# Patient Record
Sex: Male | Born: 1990 | Race: White | Hispanic: No | Marital: Single | Smoking: Never smoker
Health system: Southern US, Community
[De-identification: ages and names within clinical notes are randomized; demographics above are authoritative.]

---

## 2014-05-29 ENCOUNTER — Emergency Department (HOSPITAL_COMMUNITY)
Admission: EM | Admit: 2014-05-29 | Discharge: 2014-05-29 | Disposition: A | Discharge status: Home / Self Care | Attending: Emergency Medicine | Admitting: Emergency Medicine

## 2014-05-29 ENCOUNTER — Encounter (HOSPITAL_COMMUNITY): Payer: Self-pay | Admitting: Emergency Medicine

## 2014-05-29 DIAGNOSIS — R21 Rash and other nonspecific skin eruption: Secondary | ICD-10-CM | POA: Diagnosis present

## 2014-05-29 DIAGNOSIS — B86 Scabies: Secondary | ICD-10-CM | POA: Diagnosis not present

## 2014-05-29 MED ORDER — PERMETHRIN 5 % EX CREA: TOPICAL_CREAM | CUTANEOUS | Status: AC

## 2014-05-29 NOTE — ED Notes (Signed)
Pt denies having any animals in house.  St's noticed bites today after taking a nap.  C/o itching

## 2014-05-29 NOTE — ED Provider Notes (Signed)
CSN: [id number]     Arrival date & time 05/29/14  2126 History  This chart was scribed for Celene Skeen, PA-C working with Linwood Dibbles, MD by Elveria Rising, ED Scribe. This patient was seen in room TR08C/TR08C and the patient's care was started at 9:46 PM.   Chief Complaint  Patient presents with  . Insect Bite   The history is provided by the patient. No language interpreter was used.   HPI Comments: Sean Suarez is a 24 y.o. male who presents to the Emergency Department complaining of suspected insect bites after a nap at home today. Patient reports pruritic rash primarily to his extremities and back. Patient reports treatment with OTC anti itch cream, without reliefPatient evaluated for the bites and treated with Clindamycin for cellulitis of his right foot extending to his calf. Patient still taking the antibiotics. Patient reports improvement of the infection.  Patient lives alone. Patient reports moving into his new place one month ago. Patient denies recent contacts with similar rash.  Patient denies introduction of new dermatological products or exposure to animals.   History reviewed. No pertinent past medical history. History reviewed. No pertinent past surgical history. History reviewed. No pertinent family history. History  Substance Use Topics  . Smoking status: Never Smoker   . Smokeless tobacco: Not on file  . Alcohol Use: Yes    Review of Systems  Constitutional: Negative for fever and chills.  HENT: Negative for trouble swallowing.   Respiratory: Negative for shortness of breath.   Genitourinary: Negative.   Skin: Positive for color change.       Insect bites  All other systems reviewed and are negative.     Allergies  Review of patient's allergies indicates no known allergies.  Home Medications   Prior to Admission medications   Medication Sig Start Date End Date Taking? Authorizing Provider  permethrin (ELIMITE) 5 % cream Apply to affected area once, leave on  for 8-12 hours and rinse off. 05/29/14   Kathrynn Speed, PA-C   Triage Vitals: BP 117/74 mmHg  Pulse 53  Temp(Src) 98.3 F (36.8 C)  Resp 18  SpO2 99% Physical Exam  Constitutional: He is oriented to person, place, and time. He appears well-developed and well-nourished. No distress.  Unkempt.  HENT:  Head: Normocephalic and atraumatic.  Eyes: Conjunctivae and EOM are normal.  Neck: Normal range of motion. Neck supple.  Cardiovascular: Normal rate, regular rhythm and normal heart sounds.   Pulmonary/Chest: Effort normal and breath sounds normal.  Musculoskeletal: Normal range of motion. He exhibits no edema.  Neurological: He is alert and oriented to person, place, and time.  Skin: Skin is warm and dry.  Scattered erythematous maculopapular lesions on right ankle, behind left leg, right arm, bilateral forearms, in web spaces of right hand, and on back with small burrowing appearance of scabies. No secondary infection.  Psychiatric: He has a normal mood and affect. His behavior is normal.  Nursing note and vitals reviewed.   ED Course  Procedures (including critical care time)  COORDINATION OF CARE: 9:55 PM- Discussed treatment plan with patient at bedside and patient agreed to plan.   Labs Review Labs Reviewed - No data to display  Imaging Review No results found.   EKG Interpretation None      MDM   Final diagnoses:  Scabies   NAD. No secondary infection. Treat with permethrin. Infection care/precautions discussed. Stable for discharge. Return precautions given. Patient states understanding of treatment care plan and is  agreeable.  I personally performed the services described in this documentation, which was scribed in my presence. The recorded information has been reviewed and is accurate.  Kathrynn SpeedRobyn M Tomasita Beevers, PA-C 05/29/14 2205  Linwood DibblesJon Knapp, MD 05/30/14 [phone]

## 2014-05-29 NOTE — Discharge Instructions (Signed)
Apply permethrin cream as directed, leave on for 8-12 hours and rinse off.  Scabies Scabies are small bugs (mites) that burrow under the skin and cause red bumps and severe itching. These bugs can only be seen with a microscope. Scabies are highly contagious. They can spread easily from person to person by direct contact. They are also spread through sharing clothing or linens that have the scabies mites living in them. It is not unusual for an entire family to become infected through shared towels, clothing, or bedding.  HOME CARE INSTRUCTIONS   Your caregiver may prescribe a cream or lotion to kill the mites. If cream is prescribed, massage the cream into the entire body from the neck to the bottom of both feet. Also massage the cream into the scalp and face if your child is less than 22 year old. Avoid the eyes and mouth. Do not wash your hands after application.  Leave the cream on for 8 to 12 hours. Your child should bathe or shower after the 8 to 12 hour application period. Sometimes it is helpful to apply the cream to your child right before bedtime.  One treatment is usually effective and will eliminate approximately 95% of infestations. For severe cases, your caregiver may decide to repeat the treatment in 1 week. Everyone in your household should be treated with one application of the cream.  New rashes or burrows should not appear within 24 to 48 hours after successful treatment. However, the itching and rash may last for 2 to 4 weeks after successful treatment. Your caregiver may prescribe a medicine to help with the itching or to help the rash go away more quickly.  Scabies can live on clothing or linens for up to 3 days. All of your child's recently used clothing, towels, stuffed toys, and bed linens should be washed in hot water and then dried in a dryer for at least 20 minutes on high heat. Items that cannot be washed should be enclosed in a plastic bag for at least 3 days.  To help  relieve itching, bathe your child in a cool bath or apply cool washcloths to the affected areas.  Your child may return to school after treatment with the prescribed cream. SEEK MEDICAL CARE IF:   The itching persists longer than 4 weeks after treatment.  The rash spreads or becomes infected. Signs of infection include red blisters or yellow-tan crust. Document Released: 02/26/2005 Document Revised: 05/21/2011 Document Reviewed: 07/07/2008 [facility]Exit[facility] Patient Information 2015 [place] VillageExitCare, [place]C. This information is not intended to replace advice given to you by your health care provider. Make sure you discuss any questions you have with your health care provider.

## 2014-05-29 NOTE — ED Notes (Signed)
Patient here with numerous insect bites all over his body. States ongoing for 1.5 weeks. Was seen and started on Abx for possible cellulitis of right foot. Currently taking clindamycin. Denies fever. Presents tonight for large number of bites all over body. States they appeared today after nap. Right foot appears better to patient, mild redness noted, but swelling not apparent.

## 2014-06-13 ENCOUNTER — Emergency Department (HOSPITAL_COMMUNITY)

## 2014-06-13 ENCOUNTER — Emergency Department (HOSPITAL_COMMUNITY)
Admission: EM | Admit: 2014-06-13 | Discharge: 2014-06-13 | Disposition: A | Discharge status: Home / Self Care | Attending: Emergency Medicine | Admitting: Emergency Medicine

## 2014-06-13 ENCOUNTER — Encounter (HOSPITAL_COMMUNITY): Payer: Self-pay | Admitting: Emergency Medicine

## 2014-06-13 DIAGNOSIS — M545 Low back pain: Secondary | ICD-10-CM | POA: Diagnosis not present

## 2014-06-13 DIAGNOSIS — D72829 Elevated white blood cell count, unspecified: Secondary | ICD-10-CM

## 2014-06-13 DIAGNOSIS — R109 Unspecified abdominal pain: Secondary | ICD-10-CM | POA: Diagnosis present

## 2014-06-13 DIAGNOSIS — K5289 Other specified noninfective gastroenteritis and colitis: Secondary | ICD-10-CM | POA: Diagnosis not present

## 2014-06-13 DIAGNOSIS — K5900 Constipation, unspecified: Secondary | ICD-10-CM | POA: Insufficient documentation

## 2014-06-13 DIAGNOSIS — R1110 Vomiting, unspecified: Secondary | ICD-10-CM

## 2014-06-13 DIAGNOSIS — K529 Noninfective gastroenteritis and colitis, unspecified: Secondary | ICD-10-CM

## 2014-06-13 LAB — CBC WITH DIFFERENTIAL/PLATELET
BASOS ABS: 0.1 10*3/uL (ref 0.0–0.1)
Basophils Relative: 0 % (ref 0–1)
Eosinophils Absolute: 0 10*3/uL (ref 0.0–0.7)
Eosinophils Relative: 0 % (ref 0–5)
HCT: 54.3 % — ABNORMAL HIGH (ref 39.0–52.0)
Hemoglobin: 18.3 g/dL — ABNORMAL HIGH (ref 13.0–17.0)
LYMPHS PCT: 7 % — AB (ref 12–46)
Lymphs Abs: 1.6 10*3/uL (ref 0.7–4.0)
MCH: 31.4 pg (ref 26.0–34.0)
MCHC: 33.7 g/dL (ref 30.0–36.0)
MCV: 93.1 fL (ref 78.0–100.0)
MONO ABS: 1.8 10*3/uL — AB (ref 0.1–1.0)
MONOS PCT: 9 % (ref 3–12)
NEUTROS ABS: 18.2 10*3/uL — AB (ref 1.7–7.7)
NEUTROS PCT: 84 % — AB (ref 43–77)
Platelets: 270 10*3/uL (ref 150–400)
RBC: 5.83 MIL/uL — ABNORMAL HIGH (ref 4.22–5.81)
RDW: 12.4 % (ref 11.5–15.5)
WBC: 21.7 10*3/uL — AB (ref 4.0–10.5)

## 2014-06-13 LAB — COMPREHENSIVE METABOLIC PANEL
ALBUMIN: 5 g/dL (ref 3.5–5.2)
ALT: 35 U/L (ref 0–53)
ANION GAP: 16 — AB (ref 5–15)
AST: 39 U/L — ABNORMAL HIGH (ref 0–37)
Alkaline Phosphatase: 76 U/L (ref 39–117)
BUN: 8 mg/dL (ref 6–23)
CO2: 25 mmol/L (ref 19–32)
CREATININE: 1.32 mg/dL (ref 0.50–1.35)
Calcium: 9.5 mg/dL (ref 8.4–10.5)
Chloride: 106 mmol/L (ref 96–112)
GFR calc Af Amer: 87 mL/min — ABNORMAL LOW (ref >90)
GFR, EST NON AFRICAN AMERICAN: 75 mL/min — AB (ref >90)
GLUCOSE: 97 mg/dL (ref 70–99)
POTASSIUM: 4.1 mmol/L (ref 3.5–5.1)
Sodium: 147 mmol/L — ABNORMAL HIGH (ref 135–145)
Total Bilirubin: 0.8 mg/dL (ref 0.3–1.2)
Total Protein: 8 g/dL (ref 6.0–8.3)

## 2014-06-13 LAB — URINE MICROSCOPIC-ADD ON

## 2014-06-13 LAB — URINALYSIS, ROUTINE W REFLEX MICROSCOPIC
BILIRUBIN URINE: NEGATIVE
Glucose, UA: NEGATIVE mg/dL
Ketones, ur: NEGATIVE mg/dL
Leukocytes, UA: NEGATIVE
NITRITE: NEGATIVE
PH: 5.5 (ref 5.0–8.0)
Protein, ur: 30 mg/dL — AB
Specific Gravity, Urine: 1.028 (ref 1.005–1.030)
UROBILINOGEN UA: 0.2 mg/dL (ref 0.0–1.0)

## 2014-06-13 LAB — POC OCCULT BLOOD, ED: FECAL OCCULT BLD: NEGATIVE

## 2014-06-13 LAB — LIPASE, BLOOD: Lipase: 18 U/L (ref 11–59)

## 2014-06-13 MED ORDER — HYDROCODONE-ACETAMINOPHEN 5-325 MG PO TABS
1.0000 | ORAL_TABLET | Freq: Once | ORAL | Status: AC
  Administered 2014-06-13: 1 via ORAL
  Filled 2014-06-13: qty 1

## 2014-06-13 MED ORDER — SODIUM CHLORIDE 0.9 % IV SOLN
Freq: Once | INTRAVENOUS | Status: AC
  Administered 2014-06-13: 20:00:00 via INTRAVENOUS

## 2014-06-13 MED ORDER — ONDANSETRON 4 MG PO TBDP: 4.0000 mg | ORAL_TABLET | Freq: Three times a day (TID) | ORAL | Status: AC | PRN

## 2014-06-13 MED ORDER — CIPROFLOXACIN HCL 500 MG PO TABS
500.0000 mg | ORAL_TABLET | Freq: Once | ORAL | Status: AC
Start: 2014-06-13 — End: 2014-06-13
  Administered 2014-06-13: 500 mg via ORAL
  Filled 2014-06-13: qty 1

## 2014-06-13 MED ORDER — METRONIDAZOLE 500 MG PO TABS
500.0000 mg | ORAL_TABLET | Freq: Once | ORAL | Status: AC
  Administered 2014-06-13: 500 mg via ORAL
  Filled 2014-06-13: qty 1

## 2014-06-13 MED ORDER — IOHEXOL 300 MG/ML  SOLN
80.0000 mL | Freq: Once | INTRAMUSCULAR | Status: AC | PRN
  Administered 2014-06-13: 80 mL via INTRAVENOUS

## 2014-06-13 MED ORDER — ONDANSETRON 4 MG PO TBDP
4.0000 mg | ORAL_TABLET | Freq: Once | ORAL | Status: AC
  Administered 2014-06-13: 4 mg via ORAL
  Filled 2014-06-13: qty 1

## 2014-06-13 MED ORDER — HYDROCODONE-ACETAMINOPHEN 5-325 MG PO TABS
2.0000 | ORAL_TABLET | ORAL | Status: DC | PRN
Start: 2014-06-13 — End: 2014-09-11

## 2014-06-13 MED ORDER — IOHEXOL 300 MG/ML  SOLN
50.0000 mL | Freq: Once | INTRAMUSCULAR | Status: AC | PRN
  Administered 2014-06-13: 50 mL via ORAL

## 2014-06-13 MED ORDER — SODIUM CHLORIDE 0.9 % IV BOLUS (SEPSIS)
1000.0000 mL | Freq: Once | INTRAVENOUS | Status: AC
  Administered 2014-06-13: 1000 mL via INTRAVENOUS

## 2014-06-13 MED ORDER — ONDANSETRON HCL 4 MG/2ML IJ SOLN
4.0000 mg | Freq: Once | INTRAMUSCULAR | Status: AC
  Administered 2014-06-13: 4 mg via INTRAVENOUS
  Filled 2014-06-13: qty 2

## 2014-06-13 MED ORDER — METRONIDAZOLE 500 MG PO TABS: 500.0000 mg | ORAL_TABLET | Freq: Two times a day (BID) | ORAL | Status: AC

## 2014-06-13 MED ORDER — CIPROFLOXACIN HCL 500 MG PO TABS: 500.0000 mg | ORAL_TABLET | Freq: Two times a day (BID) | ORAL | Status: AC

## 2014-06-13 NOTE — ED Notes (Signed)
Pt to xray

## 2014-06-13 NOTE — ED Notes (Signed)
Pt from home c/o abdominal pain today. He reports difficulties having a BM over that past several days. Today he is having diarrhea with what he states "with red tinged to it". Pt reports drinking alcohol last pm

## 2014-06-13 NOTE — ED Notes (Signed)
DC instructions reviewed with patient Rx x 4 reviewed with patient Patient informed of need to make and keep f/u appointment with GI MD Patient in NAD at time of DC

## 2014-06-13 NOTE — Discharge Instructions (Signed)
Your symptoms and findings are most consistent with an infection of your: Called a "colitis". This should resolve before the time you complete your antibiotics. You're still having discomfort, or bloody stools in 7 days, please call Dr. Leone PayorGessner, a GI doctor, for reevaluation. Return to emergency with any worsening including fevers worsening pain localizing pain in her abdomen, vomiting, or other symptoms.  Colitis Colitis is inflammation of the colon. Colitis can be a short-term or long-standing (chronic) illness. Crohn's disease and ulcerative colitis are 2 types of colitis which are chronic. They usually require lifelong treatment. CAUSES  There are many different causes of colitis, including:  Viruses.  Germs (bacteria).  Medicine reactions. SYMPTOMS   Diarrhea.  Intestinal bleeding.  Pain.  Fever.  Throwing up (vomiting).  Tiredness (fatigue).  Weight loss.  Bowel blockage. DIAGNOSIS  The diagnosis of colitis is based on examination and stool or blood tests. X-rays, CT scan, and colonoscopy may also be needed. TREATMENT  Treatment may include:  Fluids given through the vein (intravenously).  Bowel rest (nothing to eat or drink for a period of time).  Medicine for pain and diarrhea.  Medicines (antibiotics) that kill germs.  Cortisone medicines.  Surgery. HOME CARE INSTRUCTIONS   Get plenty of rest.  Drink enough water and fluids to keep your urine clear or pale yellow.  Eat a well-balanced diet.  Call your caregiver for follow-up as recommended. SEEK IMMEDIATE MEDICAL CARE IF:   You develop chills.  You have an oral temperature above 102 F (38.9 C), not controlled by medicine.  You have extreme weakness, fainting, or dehydration.  You have repeated vomiting.  You develop severe belly (abdominal) pain or are passing bloody or tarry stools. MAKE SURE YOU:   Understand these instructions.  Will watch your condition.  Will get help right  away if you are not doing well or get worse. Document Released: 04/05/2004 Document Revised: 05/21/2011 Document Reviewed: 07/01/2009 [facility] [employer] Patient Information 2015 [place]restExitCare, [place]LLC. This information is not intended to replace advice given to you by your health care provider. Make sure you discuss any questions you have with your health care provider.

## 2014-06-13 NOTE — ED Provider Notes (Signed)
CSN: [id number]     Arrival date & time 06/13/14  1803 History   First MD Initiated Contact with Patient 06/13/14 1834     Chief Complaint  Patient presents with  . Abdominal Pain  . Diarrhea      HPI  Patient bridge evaluation of abdominal pain and possible constipation, as well as possible bloody stools.  Patient states he feels "like I might have a blockage". He states he feels like he needed a bowel movement. States he sits on the toilet and feels like he just cannot have a bowel movement. States occasionally some "liquid", he states it is red "but doesn't look like blood". States had several drinks last night. However, he does not feel nauseated or epigastric discomfort. Appetite last night in the morning as well. No history of episodes of GI bleeding or colitis. No history of Crohn's or ulcerative colitis. No recent fever chills or other symptoms.  History reviewed. No pertinent past medical history. History reviewed. No pertinent past surgical history. No family history on file. History  Substance Use Topics  . Smoking status: Never Smoker   . Smokeless tobacco: Not on file  . Alcohol Use: Yes    Review of Systems  Constitutional: Negative for fever, chills, diaphoresis, appetite change and fatigue.  HENT: Negative for mouth sores, sore throat and trouble swallowing.   Eyes: Negative for visual disturbance.  Respiratory: Negative for cough, chest tightness, shortness of breath and wheezing.   Cardiovascular: Negative for chest pain.  Gastrointestinal: Positive for abdominal pain and constipation. Negative for nausea, vomiting, diarrhea and abdominal distention.  Endocrine: Negative for polydipsia, polyphagia and polyuria.  Genitourinary: Negative for dysuria, frequency and hematuria.  Musculoskeletal: Negative for gait problem.  Skin: Negative for color change, pallor and rash.  Neurological: Negative for dizziness, syncope, light-headedness and headaches.  Hematological:  Does not bruise/bleed easily.  Psychiatric/Behavioral: Negative for behavioral problems and confusion.      Allergies  Review of patient's allergies indicates no known allergies.  Home Medications   Prior to Admission medications   Medication Sig Start Date End Date Taking? Authorizing Provider  ciprofloxacin (CIPRO) 500 MG tablet Take 1 tablet (500 mg total) by mouth every 12 (twelve) hours. 06/13/14   Rolland Porter, MD  HYDROcodone-acetaminophen (NORCO/VICODIN) 5-325 MG per tablet Take 2 tablets by mouth every 4 (four) hours as needed. 06/13/14   Rolland Porter, MD  metroNIDAZOLE (FLAGYL) 500 MG tablet Take 1 tablet (500 mg total) by mouth 2 (two) times daily. 06/13/14   Rolland Porter, MD  ondansetron (ZOFRAN ODT) 4 MG disintegrating tablet Take 1 tablet (4 mg total) by mouth every 8 (eight) hours as needed for nausea. 06/13/14   Rolland Porter, MD  permethrin (ELIMITE) 5 % cream Apply to affected area once, leave on for 8-12 hours and rinse off. Patient not taking: Reported on 06/13/2014 05/29/14   Nada Boozer Hess, PA-C   BP 115/67 mmHg  Pulse 70  Temp(Src) 98.6 F (37 C) (Oral)  Resp 16  SpO2 97% Physical Exam  Constitutional: He is oriented to person, place, and time. He appears well-developed and well-nourished. No distress.  HENT:  Head: Normocephalic.  Eyes: Conjunctivae are normal. Pupils are equal, round, and reactive to light. No scleral icterus.  Neck: Normal range of motion. Neck supple. No thyromegaly present.  Cardiovascular: Normal rate and regular rhythm.  Exam reveals no gallop and no friction rub.   No murmur heard. Pulmonary/Chest: Effort normal and breath sounds normal. No respiratory  distress. He has no wheezes. He has no rales.  Abdominal: Soft. Bowel sounds are normal. He exhibits no distension. There is no tenderness. There is no rebound.  Hypoactive sounds. Complains of pain in his low back. Normal appearance of his low back. Benign abdominal exam without focal tenderness. In  particular no right quadrant tenderness or rebound.  Genitourinary:  Rectal exam shows no stool in the vault. Residual on the globes is guaiac-negative. No blood. No fissures or hemorrhoids noted.  Musculoskeletal: Normal range of motion.  Neurological: He is alert and oriented to person, place, and time.  Skin: Skin is warm and dry. No rash noted.  Psychiatric: He has a normal mood and affect. His behavior is normal.    ED Course  Procedures (including critical care time) Labs Review Labs Reviewed  CBC WITH DIFFERENTIAL/PLATELET - Abnormal; Notable for the following:    WBC 21.7 (*)    RBC 5.83 (*)    Hemoglobin 18.3 (*)    HCT 54.3 (*)    Neutrophils Relative % 84 (*)    Neutro Abs 18.2 (*)    Lymphocytes Relative 7 (*)    Monocytes Absolute 1.8 (*)    All other components within normal limits  COMPREHENSIVE METABOLIC PANEL - Abnormal; Notable for the following:    Sodium 147 (*)    AST 39 (*)    GFR calc non Af Amer 75 (*)    GFR calc Af Amer 87 (*)    Anion gap 16 (*)    All other components within normal limits  URINALYSIS, ROUTINE W REFLEX MICROSCOPIC - Abnormal; Notable for the following:    APPearance TURBID (*)    Hgb urine dipstick TRACE (*)    Protein, ur 30 (*)    All other components within normal limits  URINE MICROSCOPIC-ADD ON - Abnormal; Notable for the following:    Bacteria, UA FEW (*)    All other components within normal limits  LIPASE, BLOOD  POC OCCULT BLOOD, ED    Imaging Review Dg Abd 1 View  06/13/2014   CLINICAL DATA:  Constipation for 1 day with blood in stools.  EXAM: ABDOMEN - 1 VIEW  COMPARISON:  None.  FINDINGS: Relatively gasless abdomen. There is no evidence of bowel obstruction or constipation. No concerning intra-abdominal mass effect or calcification. The visualized skeleton is negative.  IMPRESSION: Negative.  No stool retention to suggest constipation   Electronically Signed   By: Marnee SpringJonathon  Watts M.D.   On: 06/13/2014 19:10   Ct  Abdomen Pelvis W Contrast  06/13/2014   CLINICAL DATA:  Abdominal pain and constipation. Small blood and feces.  EXAM: CT ABDOMEN AND PELVIS WITH CONTRAST  TECHNIQUE: Multidetector CT imaging of the abdomen and pelvis was performed using the standard protocol following bolus administration of intravenous contrast.  CONTRAST:  80mL OMNIPAQUE IOHEXOL 300 MG/ML SOLN, 50mL OMNIPAQUE IOHEXOL 300 MG/ML SOLN  COMPARISON:  None.  FINDINGS: BODY WALL: No contributory findings.  LOWER CHEST: No contributory findings.  ABDOMEN/PELVIS:  Liver: No focal abnormality.  Biliary: No evidence of biliary obstruction or stone.  Pancreas: Unremarkable.  Spleen: Unremarkable.  Adrenals: Unremarkable.  Kidneys and ureters: Small and lobulated right kidney consistent with scarring. There is no hydronephrosis or stone. No differential renal enhancement other than a presumed sub cm cyst in the posterior hilar lip.  Bladder: Unremarkable.  Reproductive: No pathologic findings.  Bowel: There is thickening of the colonic wall diffusely, relatively mild. Beginning at these splenic flexure, the  colon is decompressed. There is mild surrounding fat inflammation and prominence of vessels. No bowel obstruction. Fluid-filled appendix with borderline thickening, 7 mm outer diameter and 2 mm single wall thickness. No surrounding fat inflammation.  Retroperitoneum: No mass or adenopathy.  Peritoneum: No ascites or pneumoperitoneum.  Vascular: No acute abnormality.  OSSEOUS: No sacroiliitis.  IMPRESSION: 1. Pancolitis. 2. Prominent appendiceal wall thickness, but no associated inflammation to suggest acute appendicitis. Follow-up could be obtained if there is concern for acute appendicitis. 3. Advanced right renal scarring.   Electronically Signed   By: Marnee Spring M.D.   On: 06/13/2014 21:51     EKG Interpretation None      MDM   Final diagnoses:  Constipation  Vomiting  Leukocytosis  Colitis    Patient with a marked leukocytosis  21,000 with CT scan shows colitis. Shows prominence of his appendix but no periappendiceal information. On repeat exam he has no tenderness over the right lower quadrant. Symptoms are consistent with a colitis. I suspect that what he is seeing is likely some passage of blood secondary to his colitis although the residual on his rectal exam was guaiac-negative. He was given by mouth Flagyl, Cipro, Zofran, Vicodin. He is taking this with fluids without difficult. No additional nausea vomiting. States he is hungry. His given food here and tolerates this well. Strong desire be treated as an outpatient. I given him GI to follow up with of his symptoms did not resolve. Recheck with any worsening symptoms.    Rolland Porter, MD 06/13/14 2241

## 2014-06-13 NOTE — ED Notes (Addendum)
Pt alert and oriented x4. Respirations even and unlabored, bilateral symmetrical rise and fall of chest. Skin warm and dry. In no acute distress. Denies needs.   

## 2014-09-11 ENCOUNTER — Encounter (HOSPITAL_BASED_OUTPATIENT_CLINIC_OR_DEPARTMENT_OTHER): Payer: Self-pay

## 2014-09-11 ENCOUNTER — Emergency Department (HOSPITAL_BASED_OUTPATIENT_CLINIC_OR_DEPARTMENT_OTHER)
Admission: EM | Admit: 2014-09-11 | Discharge: 2014-09-11 | Disposition: A | Discharge status: Home / Self Care | Payer: Worker's Compensation | Attending: Emergency Medicine | Admitting: Emergency Medicine

## 2014-09-11 ENCOUNTER — Emergency Department (HOSPITAL_BASED_OUTPATIENT_CLINIC_OR_DEPARTMENT_OTHER): Payer: Worker's Compensation

## 2014-09-11 DIAGNOSIS — S99921A Unspecified injury of right foot, initial encounter: Secondary | ICD-10-CM | POA: Diagnosis present

## 2014-09-11 DIAGNOSIS — S9701XA Crushing injury of right ankle, initial encounter: Secondary | ICD-10-CM | POA: Insufficient documentation

## 2014-09-11 DIAGNOSIS — Y990 Civilian activity done for income or pay: Secondary | ICD-10-CM | POA: Insufficient documentation

## 2014-09-11 DIAGNOSIS — S9781XA Crushing injury of right foot, initial encounter: Secondary | ICD-10-CM | POA: Insufficient documentation

## 2014-09-11 DIAGNOSIS — S92422A Displaced fracture of distal phalanx of left great toe, initial encounter for closed fracture: Secondary | ICD-10-CM | POA: Insufficient documentation

## 2014-09-11 DIAGNOSIS — Y9289 Other specified places as the place of occurrence of the external cause: Secondary | ICD-10-CM | POA: Diagnosis not present

## 2014-09-11 DIAGNOSIS — S92912A Unspecified fracture of left toe(s), initial encounter for closed fracture: Secondary | ICD-10-CM

## 2014-09-11 DIAGNOSIS — Y9389 Activity, other specified: Secondary | ICD-10-CM | POA: Diagnosis not present

## 2014-09-11 DIAGNOSIS — W228XXA Striking against or struck by other objects, initial encounter: Secondary | ICD-10-CM | POA: Diagnosis not present

## 2014-09-11 DIAGNOSIS — Z792 Long term (current) use of antibiotics: Secondary | ICD-10-CM | POA: Diagnosis not present

## 2014-09-11 MED ORDER — HYDROCODONE-ACETAMINOPHEN 5-325 MG PO TABS: 2.0000 | ORAL_TABLET | ORAL | Status: AC | PRN

## 2014-09-11 NOTE — ED Notes (Signed)
Pt reports he was at work today when a heavy piece of equipment [employer]) ran over his left foot -pt c/o pain, edema, redness to set of toes. Pt states he spoke to Weston BrassNick, Merchandiser, retailupervisor, and Weston Brassick stated a UDS was not required.

## 2014-09-11 NOTE — Discharge Instructions (Signed)
Crush Injury, Fingers or Toes °A crush injury to the fingers or toes means the tissues have been damaged by being squeezed (compressed). There will be bleeding into the tissues and swelling. Often, blood will collect under the skin. When this happens, the skin on the finger often dies and may slough off (shed) 1 week to 10 days later. Usually, new skin is growing underneath. If the injury has been too severe and the tissue does not survive, the damaged tissue may begin to turn black over several days.  °Wounds which occur because of the crushing may be stitched (sutured) shut. However, crush injuries are more likely to become infected than other injuries. These wounds may not be closed as tightly as other types of cuts to prevent infection. Nails involved are often lost. These usually grow back over several weeks.  °DIAGNOSIS °X-rays may be taken to see if there is any injury to the bones. °TREATMENT °Broken bones (fractures) may be treated with splinting, depending on the fracture. Often, no treatment is required for fractures of the last bone in the fingers or toes. °HOME CARE INSTRUCTIONS  °· The crushed part should be raised (elevated) above the heart or center of the chest as much as possible for the first several days or as directed. This helps with pain and lessens swelling. Less swelling increases the chances that the crushed part will survive. °· Put ice on the injured area. °¨ Put ice in a plastic bag. °¨ Place a towel between your skin and the bag. °¨ Leave the ice on for 15-20 minutes, 03-04 times a day for the first 2 days. °· Only take over-the-counter or prescription medicines for pain, discomfort, or fever as directed by your caregiver. °· Use your injured part only as directed. °· Change your bandages (dressings) as directed. °· Keep all follow-up appointments as directed by your caregiver. Not keeping your appointment could result in a chronic or permanent injury, pain, and disability. If there is  any problem keeping the appointment, you must call to reschedule. °SEEK IMMEDIATE MEDICAL CARE IF:  °· There is redness, swelling, or increasing pain in the wound area. °· Pus is coming from the wound. °· You have a fever. °· You notice a bad smell coming from the wound or dressing. °· The edges of the wound do not stay together after the sutures have been removed. °· You are unable to move the injured finger or toe. °MAKE SURE YOU:  °· Understand these instructions. °· Will watch your condition. °· Will get help right away if you are not doing well or get worse. °Document Released: 02/26/2005 Document Revised: 05/21/2011 Document Reviewed: 07/14/2010 °ExitCare® Patient Information ©2015 [employer], [employer]. This information is not intended to replace advice given to you by your health care provider. Make sure you discuss any questions you have with your health care provider. ° °

## 2014-09-11 NOTE — ED Provider Notes (Signed)
CSN: [id number]     Arrival date & time 09/11/14  1549 History   First MD Initiated Contact with Patient 09/11/14 1559     Chief Complaint  Patient presents with  . Foot Injury     (Consider location/radiation/quality/duration/timing/severity/associated sxs/prior Treatment) Patient is a 24 y.o. male presenting with foot injury. The history is provided by the patient. No language interpreter was used.  Foot Injury Location:  Foot Time since incident:  3 hours Injury: no   Foot location:  L foot Pain details:    Quality:  Aching   Radiates to:  Does not radiate   Severity:  Moderate   Onset quality:  Gradual   Progression:  Partially resolved Chronicity:  New Dislocation: no   Foreign body present:  No foreign bodies Relieved by:  Nothing Worsened by:  Nothing tried Ineffective treatments:  None tried Associated symptoms: swelling   Risk factors: no concern for non-accidental trauma and no known bone disorder     History reviewed. No pertinent past medical history. History reviewed. No pertinent past surgical history. History reviewed. No pertinent family history. History  Substance Use Topics  . Smoking status: Never Smoker   . Smokeless tobacco: Not on file  . Alcohol Use: Yes     Comment: occasional    Review of Systems  All other systems reviewed and are negative.     Allergies  Review of patient's allergies indicates no known allergies.  Home Medications   Prior to Admission medications   Medication Sig Start Date End Date Taking? Authorizing Provider  ciprofloxacin (CIPRO) 500 MG tablet Take 1 tablet (500 mg total) by mouth every 12 (twelve) hours. 06/13/14   Rolland Porter, MD  HYDROcodone-acetaminophen (NORCO/VICODIN) 5-325 MG per tablet Take 2 tablets by mouth every 4 (four) hours as needed. 06/13/14   Rolland Porter, MD  metroNIDAZOLE (FLAGYL) 500 MG tablet Take 1 tablet (500 mg total) by mouth 2 (two) times daily. 06/13/14   Rolland Porter, MD  ondansetron (ZOFRAN  ODT) 4 MG disintegrating tablet Take 1 tablet (4 mg total) by mouth every 8 (eight) hours as needed for nausea. 06/13/14   Rolland Porter, MD  permethrin (ELIMITE) 5 % cream Apply to affected area once, leave on for 8-12 hours and rinse off. Patient not taking: Reported on 06/13/2014 05/29/14   Robyn M Hess, Sean Suarez   BP 108/60 mmHg  Pulse 73  Temp(Src) 98.7 F (37.1 C) (Oral)  Resp 18  Ht  (1.753 m)  Wt 130 lb (58.968 kg)  BMI 19.19 kg/m2  SpO2 98% Physical Exam  Constitutional: He is oriented to person, place, and time. He appears well-developed and well-nourished.  HENT:  Head: Normocephalic.  Eyes: EOM are normal.  Neck: Normal range of motion.  Pulmonary/Chest: Effort normal.  Abdominal: He exhibits no distension.  Musculoskeletal: He exhibits tenderness.  Swollen red 1st-4th toe,  Erythema to mid tarsal areas nv and ns intact  Neurological: He is alert and oriented to person, place, and time.  Psychiatric: He has a normal mood and affect.  Nursing note and vitals reviewed.   ED Course  Procedures (including critical care time) Labs Review Labs Reviewed - No data to display  Imaging Review Dg Foot Complete Left  09/11/2014   CLINICAL DATA:  Pain, swelling, and bruising secondary to blunt trauma when an airplane jack fell on the patient's foot.  EXAM: LEFT FOOT - COMPLETE 3+ VIEW  COMPARISON:  None.  FINDINGS: There is a tiny avulsion  of bone from the dorsal lateral aspect of the base of the distal phalanx of the great toe. The other bones of the foot are normal.  IMPRESSION: Small avulsion fracture of the base of the distal phalanx of the great toe.   Electronically Signed   By: Francene BoyersJames  Maxwell M.D.   On: 09/11/2014 16:24     EKG Interpretation None      MDM   Final diagnoses:  Crushing injury of ankle and foot excluding toes, right, initial encounter  Fracture, toe, left, closed, initial encounter    Post op shoe Buddy tape    Sean AreasLeslie K Sofia, Sean Suarez 09/11/14  [address]1633  Leslie K DallasSofia, Sean Suarez 09/11/14 1638  Gwyneth SproutWhitney Plunkett, MD 09/11/14 2337

## 2014-10-03 ENCOUNTER — Emergency Department (HOSPITAL_COMMUNITY)
Admission: EM | Admit: 2014-10-03 | Discharge: 2014-10-03 | Disposition: A | Discharge status: Home / Self Care | Attending: Emergency Medicine | Admitting: Emergency Medicine

## 2014-10-03 ENCOUNTER — Encounter (HOSPITAL_COMMUNITY): Payer: Self-pay | Admitting: Emergency Medicine

## 2014-10-03 DIAGNOSIS — Y998 Other external cause status: Secondary | ICD-10-CM | POA: Diagnosis not present

## 2014-10-03 DIAGNOSIS — X58XXXA Exposure to other specified factors, initial encounter: Secondary | ICD-10-CM | POA: Insufficient documentation

## 2014-10-03 DIAGNOSIS — Y9389 Activity, other specified: Secondary | ICD-10-CM | POA: Diagnosis not present

## 2014-10-03 DIAGNOSIS — K226 Gastro-esophageal laceration-hemorrhage syndrome: Secondary | ICD-10-CM | POA: Diagnosis not present

## 2014-10-03 DIAGNOSIS — S39012A Strain of muscle, fascia and tendon of lower back, initial encounter: Secondary | ICD-10-CM | POA: Diagnosis not present

## 2014-10-03 DIAGNOSIS — Z792 Long term (current) use of antibiotics: Secondary | ICD-10-CM | POA: Insufficient documentation

## 2014-10-03 DIAGNOSIS — R112 Nausea with vomiting, unspecified: Secondary | ICD-10-CM | POA: Diagnosis present

## 2014-10-03 DIAGNOSIS — Y9289 Other specified places as the place of occurrence of the external cause: Secondary | ICD-10-CM | POA: Insufficient documentation

## 2014-10-03 LAB — I-STAT CHEM 8, ED
BUN: 29 mg/dL — AB (ref 6–20)
CHLORIDE: 105 mmol/L (ref 101–111)
CREATININE: 1.1 mg/dL (ref 0.61–1.24)
Calcium, Ion: 1.14 mmol/L (ref 1.12–1.23)
Glucose, Bld: 89 mg/dL (ref 65–99)
HCT: 51 % (ref 39.0–52.0)
Hemoglobin: 17.3 g/dL — ABNORMAL HIGH (ref 13.0–17.0)
POTASSIUM: 4.7 mmol/L (ref 3.5–5.1)
Sodium: 140 mmol/L (ref 135–145)
TCO2: 18 mmol/L (ref 0–100)

## 2014-10-03 MED ORDER — ONDANSETRON HCL 4 MG/2ML IJ SOLN
4.0000 mg | Freq: Once | INTRAMUSCULAR | Status: AC
  Administered 2014-10-03: 4 mg via INTRAVENOUS
  Filled 2014-10-03: qty 2

## 2014-10-03 MED ORDER — SODIUM CHLORIDE 0.9 % IV BOLUS (SEPSIS)
1000.0000 mL | INTRAVENOUS | Status: AC
  Administered 2014-10-03: 1000 mL via INTRAVENOUS

## 2014-10-03 MED ORDER — ACETAMINOPHEN 325 MG PO TABS
650.0000 mg | ORAL_TABLET | Freq: Once | ORAL | Status: AC
  Administered 2014-10-03: 650 mg via ORAL
  Filled 2014-10-03: qty 2

## 2014-10-03 MED ORDER — GI COCKTAIL ~~LOC~~
30.0000 mL | Freq: Once | ORAL | Status: AC
  Administered 2014-10-03: 30 mL via ORAL
  Filled 2014-10-03: qty 30

## 2014-10-03 MED ORDER — FAMOTIDINE 20 MG PO TABS
10.0000 mg | ORAL_TABLET | ORAL | Status: AC
  Administered 2014-10-03: 10 mg via ORAL
  Filled 2014-10-03: qty 1

## 2014-10-03 NOTE — ED Provider Notes (Signed)
CSN: [id number]     Arrival date & time 10/03/14  1752 History   First MD Initiated Contact with Patient 10/03/14 1815     No chief complaint on file.    (Consider location/radiation/quality/duration/timing/severity/associated sxs/prior Treatment) Patient is a 24 y.o. male presenting with vomiting and back pain. The history is provided by the patient.  Emesis Severity:  Moderate Duration:  6 hours Number of daily episodes:  Red and brownish Quality:  Stomach contents Able to tolerate:  Liquids Progression:  Unchanged Chronicity:  New Relieved by:  Nothing Worsened by:  Nothing tried Ineffective treatments:  None tried Associated symptoms: no abdominal pain, no diarrhea and no headaches   Back Pain Location:  Lumbar spine Quality:  Aching Radiates to:  Does not radiate Pain severity:  Mild Pain is:  Same all the time Onset quality:  Gradual Timing:  Constant Progression:  Unchanged Chronicity:  New Context comment:  Helping a friend move yesterday Relieved by:  Nothing Worsened by:  Nothing tried Ineffective treatments:  None tried Associated symptoms: no abdominal pain, no chest pain, no dysuria, no fever, no headaches and no numbness     No past medical history on file. No past surgical history on file. No family history on file. History  Substance Use Topics  . Smoking status: Never Smoker   . Smokeless tobacco: Not on file  . Alcohol Use: Yes     Comment: occasional    Review of Systems  Constitutional: Negative for fever.  HENT: Negative for drooling and rhinorrhea.   Eyes: Negative for pain.  Respiratory: Negative for cough and shortness of breath.   Cardiovascular: Negative for chest pain and leg swelling.  Gastrointestinal: Positive for nausea and vomiting. Negative for abdominal pain and diarrhea.  Genitourinary: Negative for dysuria and hematuria.  Musculoskeletal: Positive for back pain. Negative for gait problem and neck pain.  Skin: Negative for  color change.  Neurological: Negative for numbness and headaches.  Hematological: Negative for adenopathy.  Psychiatric/Behavioral: Negative for behavioral problems.  All other systems reviewed and are negative.     Allergies  Review of patient's allergies indicates no known allergies.  Home Medications   Prior to Admission medications   Medication Sig Start Date End Date Taking? Authorizing Provider  ciprofloxacin (CIPRO) 500 MG tablet Take 1 tablet (500 mg total) by mouth every 12 (twelve) hours. 06/13/14   Rolland Porter, MD  HYDROcodone-acetaminophen (NORCO/VICODIN) 5-325 MG per tablet Take 2 tablets by mouth every 4 (four) hours as needed. 09/11/14   Elson Areas, PA-C  metroNIDAZOLE (FLAGYL) 500 MG tablet Take 1 tablet (500 mg total) by mouth 2 (two) times daily. 06/13/14   Rolland Porter, MD  ondansetron (ZOFRAN ODT) 4 MG disintegrating tablet Take 1 tablet (4 mg total) by mouth every 8 (eight) hours as needed for nausea. 06/13/14   Rolland Porter, MD  permethrin (ELIMITE) 5 % cream Apply to affected area once, leave on for 8-12 hours and rinse off. Patient not taking: Reported on 06/13/2014 05/29/14   Kathrynn Speed, PA-C   There were no vitals taken for this visit. Physical Exam  Constitutional: He is oriented to person, place, and time. He appears well-developed and well-nourished.  HENT:  Head: Normocephalic and atraumatic.  Right Ear: External ear normal.  Left Ear: External ear normal.  Nose: Nose normal.  Mouth/Throat: Oropharynx is clear and moist. No oropharyngeal exudate.  Eyes: Conjunctivae and EOM are normal. Pupils are equal, round, and reactive to light.  Neck: Normal range of motion. Neck supple.  Cardiovascular: Normal rate, regular rhythm, normal heart sounds and intact distal pulses.  Exam reveals no gallop and no friction rub.   No murmur heard. Pulmonary/Chest: Effort normal and breath sounds normal. No respiratory distress. He has no wheezes.  Abdominal: Soft. Bowel sounds  are normal. He exhibits no distension. There is no tenderness. There is no rebound and no guarding.  Musculoskeletal: Normal range of motion. He exhibits tenderness. He exhibits no edema.  No focal vertebral tenderness. Mild right paralumbar tenderness.  Neurological: He is alert and oriented to person, place, and time.  Skin: Skin is warm and dry.  Psychiatric: He has a normal mood and affect. His behavior is normal.  Nursing note and vitals reviewed.   ED Course  Procedures (including critical care time) Labs Review Labs Reviewed  I-STAT CHEM 8, ED - Abnormal; Notable for the following:    BUN 29 (*)    Hemoglobin 17.3 (*)    All other components within normal limits    Imaging Review No results found.   EKG Interpretation None      MDM   Final diagnoses:  Mallory-Weiss tear  Low back strain, initial encounter    6:29 PM 24 y.o. male who presents with vomiting and possible hematemesis. He states that yesterday he was helping his friend moved throughout the day and lifting a lot of boxes. He went out drinking last night and had 3 [employer] [place] ice teas. Around noon today he had an episode of emesis which appeared to be the pizza that he ate last night. After this he has had multiple episodes with a brownish reddish substance which he suspects is blood. Vital signs unremarkable here. He does have some low back pain which she relates to lifting yesterday but denies any abdominal or chest pain. Will get screening i-STAT Chem-8, IV fluids, GI cocktail. Suspect he has a Mallory-Weiss tear.  8:38 PM: Pt feeling much better. VSS. I interpreted/reviewed the labs and/or imaging which were non-contributory.  I have discussed the diagnosis/risks/treatment options with the patient and believe the pt to be eligible for discharge home to follow-up with his pcp as needed. We also discussed returning to the ED immediately if new or worsening sx occur. We discussed the sx which are most concerning  (e.g., worsening hematemesis, intractable vomiting, abd pain, cp, sob, fever, bloody stools) that necessitate immediate return. Medications administered to the patient during their visit and any new prescriptions provided to the patient are listed below.  Medications given during this visit Medications  sodium chloride 0.9 % bolus 1,000 mL (0 mLs Intravenous Stopped 10/03/14 1947)  ondansetron (ZOFRAN) injection 4 mg (4 mg Intravenous Given 10/03/14 1854)  gi cocktail (Maalox,Lidocaine,Donnatal) (30 mLs Oral Given 10/03/14 1856)  famotidine (PEPCID) tablet 10 mg (10 mg Oral Given 10/03/14 1855)  acetaminophen (TYLENOL) tablet 650 mg (650 mg Oral Given 10/03/14 1854)    New Prescriptions   No medications on file     Purvis Sheffield, MD 10/03/14 2038

## 2014-10-03 NOTE — ED Notes (Signed)
Pt reports nausea and vomiting wih back pain since 1100 this am.  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  He estimates at least 15 hours.

## 2017-02-10 IMAGING — CR DG ABDOMEN 1V
1 series · 1 of 1 positions shown · non-contrast
Comparison: None.

CLINICAL DATA: Constipation for 1 day with blood in stools.

EXAM:
ABDOMEN - 1 VIEW

[t abdomen supine]
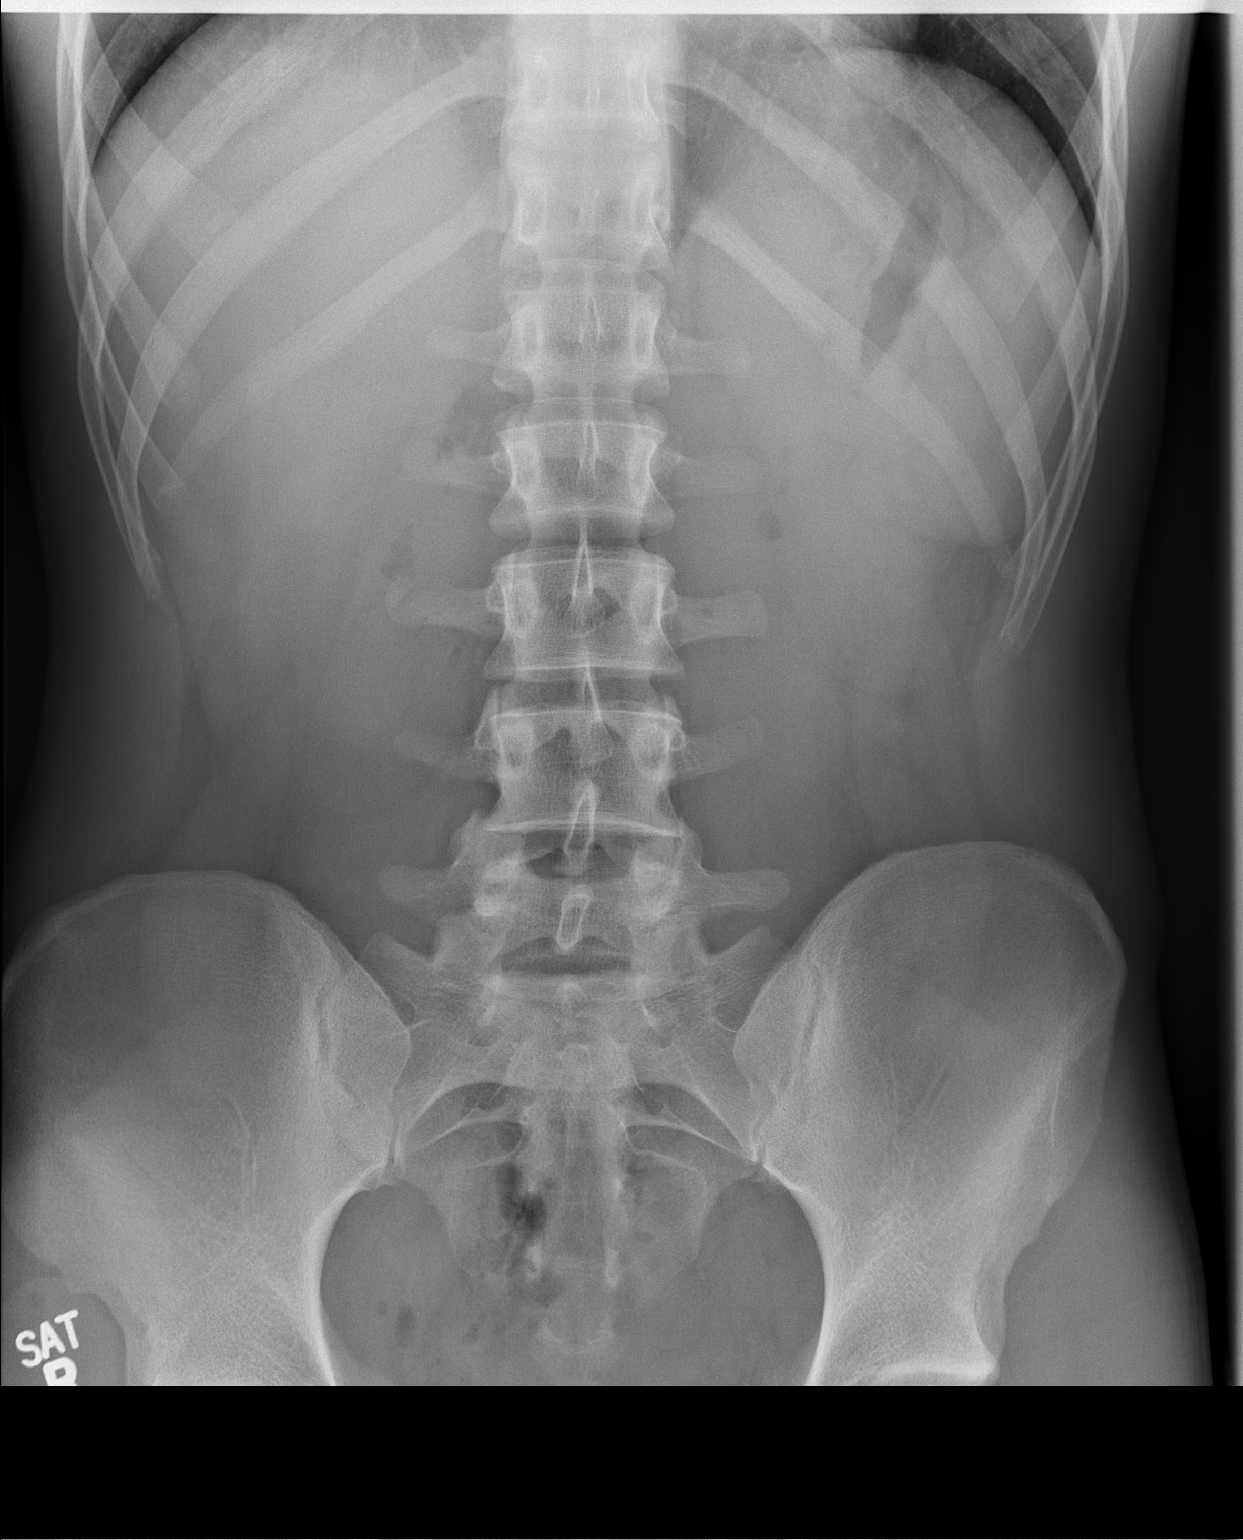

[1 of 1 positions shown; findings below may reference images not displayed]

FINDINGS: Relatively gasless abdomen. There is no evidence of bowel
obstruction or constipation. No concerning intra-abdominal mass
effect or calcification. The visualized skeleton is negative.
IMPRESSION: Negative.  No stool retention to suggest constipation
